# Patient Record
Sex: Female | Born: 1965 | Race: White | Hispanic: No | Marital: Married | Smoking: Never smoker
Health system: Southern US, Community
[De-identification: ages and names within clinical notes are randomized; demographics above are authoritative.]

## PROBLEM LIST (undated history)

## (undated) DIAGNOSIS — E039 Hypothyroidism, unspecified: Secondary | ICD-10-CM

## (undated) DIAGNOSIS — D0439 Carcinoma in situ of skin of other parts of face: Secondary | ICD-10-CM

## (undated) HISTORY — DX: Hypothyroidism, unspecified: E03.9

## (undated) HISTORY — DX: Carcinoma in situ of skin of other parts of face: D04.39

---

## 2006-09-25 ENCOUNTER — Encounter: Admission: RE | Admit: 2006-09-25 | Discharge: 2006-09-25 | Payer: Self-pay | Admitting: Specialist

## 2007-03-06 ENCOUNTER — Encounter: Admission: RE | Admit: 2007-03-06 | Discharge: 2007-03-06 | Payer: Self-pay | Admitting: *Deleted

## 2007-09-26 ENCOUNTER — Encounter: Admission: RE | Admit: 2007-09-26 | Discharge: 2007-09-26 | Payer: Self-pay | Admitting: Obstetrics and Gynecology

## 2007-10-24 ENCOUNTER — Ambulatory Visit (HOSPITAL_COMMUNITY): Admission: RE | Admit: 2007-10-24 | Discharge: 2007-10-24 | Payer: Self-pay | Admitting: Family Medicine

## 2008-11-08 ENCOUNTER — Encounter: Admission: RE | Admit: 2008-11-08 | Discharge: 2008-11-08 | Payer: Self-pay | Admitting: Family Medicine

## 2010-07-18 ENCOUNTER — Other Ambulatory Visit: Payer: Self-pay | Admitting: Specialist

## 2010-07-18 DIAGNOSIS — Z1231 Encounter for screening mammogram for malignant neoplasm of breast: Secondary | ICD-10-CM

## 2010-07-25 ENCOUNTER — Ambulatory Visit
Admission: RE | Admit: 2010-07-25 | Discharge: 2010-07-25 | Disposition: A | Discharge status: Home / Self Care | Payer: Self-pay | Source: Ambulatory Visit | Attending: Specialist | Admitting: Specialist

## 2010-07-25 DIAGNOSIS — Z1231 Encounter for screening mammogram for malignant neoplasm of breast: Secondary | ICD-10-CM

## 2011-08-08 ENCOUNTER — Other Ambulatory Visit: Payer: Self-pay | Admitting: Specialist

## 2011-08-08 DIAGNOSIS — Z139 Encounter for screening, unspecified: Secondary | ICD-10-CM

## 2011-08-14 ENCOUNTER — Ambulatory Visit (INDEPENDENT_AMBULATORY_CARE_PROVIDER_SITE_OTHER): Payer: BC Managed Care – PPO

## 2011-08-14 DIAGNOSIS — Z139 Encounter for screening, unspecified: Secondary | ICD-10-CM

## 2011-08-14 DIAGNOSIS — Z1231 Encounter for screening mammogram for malignant neoplasm of breast: Secondary | ICD-10-CM

## 2012-07-31 ENCOUNTER — Other Ambulatory Visit: Payer: Self-pay | Admitting: Specialist

## 2012-07-31 DIAGNOSIS — Z1231 Encounter for screening mammogram for malignant neoplasm of breast: Secondary | ICD-10-CM

## 2012-08-14 ENCOUNTER — Ambulatory Visit (INDEPENDENT_AMBULATORY_CARE_PROVIDER_SITE_OTHER): Payer: BC Managed Care – PPO

## 2012-08-14 DIAGNOSIS — Z1231 Encounter for screening mammogram for malignant neoplasm of breast: Secondary | ICD-10-CM

## 2013-08-03 ENCOUNTER — Other Ambulatory Visit: Payer: Self-pay | Admitting: Specialist

## 2013-08-03 DIAGNOSIS — Z1231 Encounter for screening mammogram for malignant neoplasm of breast: Secondary | ICD-10-CM

## 2013-08-06 ENCOUNTER — Ambulatory Visit: Payer: BC Managed Care – PPO

## 2013-08-20 ENCOUNTER — Ambulatory Visit (INDEPENDENT_AMBULATORY_CARE_PROVIDER_SITE_OTHER): Payer: BC Managed Care – PPO

## 2013-08-20 DIAGNOSIS — Z1231 Encounter for screening mammogram for malignant neoplasm of breast: Secondary | ICD-10-CM

## 2014-08-05 ENCOUNTER — Other Ambulatory Visit (HOSPITAL_BASED_OUTPATIENT_CLINIC_OR_DEPARTMENT_OTHER): Payer: Self-pay | Admitting: Specialist

## 2014-08-05 DIAGNOSIS — Z1239 Encounter for other screening for malignant neoplasm of breast: Secondary | ICD-10-CM

## 2014-08-26 ENCOUNTER — Ambulatory Visit (INDEPENDENT_AMBULATORY_CARE_PROVIDER_SITE_OTHER): Payer: BC Managed Care – PPO

## 2014-08-26 DIAGNOSIS — Z1231 Encounter for screening mammogram for malignant neoplasm of breast: Secondary | ICD-10-CM | POA: Diagnosis not present

## 2014-08-26 DIAGNOSIS — Z1239 Encounter for other screening for malignant neoplasm of breast: Secondary | ICD-10-CM

## 2015-08-01 ENCOUNTER — Other Ambulatory Visit: Payer: Self-pay | Admitting: Specialist

## 2015-08-01 DIAGNOSIS — Z1231 Encounter for screening mammogram for malignant neoplasm of breast: Secondary | ICD-10-CM

## 2015-08-31 ENCOUNTER — Ambulatory Visit: Payer: BC Managed Care – PPO

## 2015-09-06 ENCOUNTER — Ambulatory Visit (INDEPENDENT_AMBULATORY_CARE_PROVIDER_SITE_OTHER): Payer: BC Managed Care – PPO

## 2015-09-06 DIAGNOSIS — Z1231 Encounter for screening mammogram for malignant neoplasm of breast: Secondary | ICD-10-CM

## 2015-09-06 DIAGNOSIS — R928 Other abnormal and inconclusive findings on diagnostic imaging of breast: Secondary | ICD-10-CM | POA: Diagnosis not present

## 2015-09-07 ENCOUNTER — Other Ambulatory Visit: Payer: Self-pay | Admitting: Specialist

## 2015-09-07 DIAGNOSIS — R928 Other abnormal and inconclusive findings on diagnostic imaging of breast: Secondary | ICD-10-CM

## 2015-09-13 ENCOUNTER — Ambulatory Visit
Admission: RE | Admit: 2015-09-13 | Discharge: 2015-09-13 | Disposition: A | Discharge status: Home / Self Care | Payer: BC Managed Care – PPO | Source: Ambulatory Visit | Attending: Specialist | Admitting: Specialist

## 2015-09-13 DIAGNOSIS — R928 Other abnormal and inconclusive findings on diagnostic imaging of breast: Secondary | ICD-10-CM

## 2016-03-07 DIAGNOSIS — E039 Hypothyroidism, unspecified: Secondary | ICD-10-CM | POA: Insufficient documentation

## 2016-08-20 ENCOUNTER — Other Ambulatory Visit: Payer: Self-pay | Admitting: Specialist

## 2016-08-20 DIAGNOSIS — Z1231 Encounter for screening mammogram for malignant neoplasm of breast: Secondary | ICD-10-CM

## 2016-09-26 ENCOUNTER — Other Ambulatory Visit: Payer: Self-pay | Admitting: Specialist

## 2016-09-26 ENCOUNTER — Ambulatory Visit (INDEPENDENT_AMBULATORY_CARE_PROVIDER_SITE_OTHER): Payer: BC Managed Care – PPO

## 2016-09-26 DIAGNOSIS — Z1231 Encounter for screening mammogram for malignant neoplasm of breast: Secondary | ICD-10-CM

## 2017-09-09 ENCOUNTER — Other Ambulatory Visit: Payer: Self-pay | Admitting: Specialist

## 2017-09-09 DIAGNOSIS — Z1231 Encounter for screening mammogram for malignant neoplasm of breast: Secondary | ICD-10-CM

## 2017-09-27 ENCOUNTER — Ambulatory Visit (INDEPENDENT_AMBULATORY_CARE_PROVIDER_SITE_OTHER): Payer: BC Managed Care – PPO

## 2017-09-27 DIAGNOSIS — Z1231 Encounter for screening mammogram for malignant neoplasm of breast: Secondary | ICD-10-CM | POA: Diagnosis not present

## 2018-09-26 ENCOUNTER — Other Ambulatory Visit: Payer: Self-pay | Admitting: Nurse Practitioner

## 2018-09-26 DIAGNOSIS — Z1231 Encounter for screening mammogram for malignant neoplasm of breast: Secondary | ICD-10-CM

## 2018-10-22 ENCOUNTER — Ambulatory Visit (INDEPENDENT_AMBULATORY_CARE_PROVIDER_SITE_OTHER): Payer: BC Managed Care – PPO

## 2018-10-22 ENCOUNTER — Other Ambulatory Visit: Payer: Self-pay

## 2018-10-22 DIAGNOSIS — Z1231 Encounter for screening mammogram for malignant neoplasm of breast: Secondary | ICD-10-CM

## 2019-09-23 ENCOUNTER — Other Ambulatory Visit: Payer: Self-pay | Admitting: Nurse Practitioner

## 2019-09-23 ENCOUNTER — Other Ambulatory Visit: Payer: Self-pay | Admitting: Specialist

## 2019-09-23 DIAGNOSIS — Z1231 Encounter for screening mammogram for malignant neoplasm of breast: Secondary | ICD-10-CM

## 2019-10-28 ENCOUNTER — Other Ambulatory Visit: Payer: Self-pay

## 2019-10-28 ENCOUNTER — Ambulatory Visit (INDEPENDENT_AMBULATORY_CARE_PROVIDER_SITE_OTHER): Payer: BC Managed Care – PPO

## 2019-10-28 DIAGNOSIS — Z1231 Encounter for screening mammogram for malignant neoplasm of breast: Secondary | ICD-10-CM | POA: Diagnosis not present

## 2020-03-07 ENCOUNTER — Encounter: Payer: Self-pay | Admitting: Family Medicine

## 2020-03-07 ENCOUNTER — Ambulatory Visit (INDEPENDENT_AMBULATORY_CARE_PROVIDER_SITE_OTHER): Payer: Self-pay | Admitting: Family Medicine

## 2020-03-07 ENCOUNTER — Other Ambulatory Visit: Payer: Self-pay

## 2020-03-07 VITALS — BP 124/79 | HR 67 | Temp 98.0°F | Ht 65.0 in | Wt 123.1 lb

## 2020-03-07 DIAGNOSIS — E039 Hypothyroidism, unspecified: Secondary | ICD-10-CM

## 2020-03-07 DIAGNOSIS — Z1211 Encounter for screening for malignant neoplasm of colon: Secondary | ICD-10-CM

## 2020-03-07 DIAGNOSIS — G5620 Lesion of ulnar nerve, unspecified upper limb: Secondary | ICD-10-CM | POA: Insufficient documentation

## 2020-03-07 DIAGNOSIS — G5622 Lesion of ulnar nerve, left upper limb: Secondary | ICD-10-CM

## 2020-03-07 DIAGNOSIS — K829 Disease of gallbladder, unspecified: Secondary | ICD-10-CM

## 2020-03-07 NOTE — Assessment & Plan Note (Addendum)
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  Does not want to do anything for this at this time.

## 2020-03-07 NOTE — Assessment & Plan Note (Signed)
Last [place] in 2018, will order updated imaging.  Check LFT's today. She remains hesitant about surgery.  Discussed potential complications of not having removal.

## 2020-03-07 NOTE — Assessment & Plan Note (Addendum)
Managed by [facility], feels good at current dose of medcations.

## 2020-03-07 NOTE — Patient Instructions (Signed)
Gallbladder Eating Plan If you have a gallbladder condition, you may have trouble digesting fats. Eating a low-fat diet can help reduce your symptoms, and may be helpful before and after having surgery to remove your gallbladder (cholecystectomy). Your health care provider may recommend that you work with a diet and nutrition specialist (dietitian) to help you reduce the amount of fat in your diet. What are tips for following this plan? General guidelines  Limit your fat intake to less than 30% of your total daily calories. If you eat around 1,800 calories each day, this is less than 60 grams (g) of fat per day.  Fat is an important part of a healthy diet. Eating a low-fat diet can make it hard to maintain a healthy body weight. Ask your dietitian how much fat, calories, and other nutrients you need each day.  Eat small, frequent meals throughout the day instead of three large meals.  Drink at least 8-10 cups of fluid a day. Drink enough fluid to keep your urine clear or pale yellow.  Limit alcohol intake to no more than 1 drink a day for nonpregnant women and 2 drinks a day for men. One drink equals 12 oz of beer, 5 oz of wine, or 1 oz of hard liquor. Reading food labels  Check Nutrition Facts on food labels for the amount of fat per serving. Choose foods with less than 3 grams of fat per serving.   Shopping  Choose nonfat and low-fat healthy foods. Look for the words "nonfat," "low fat," or "fat free."  Avoid buying processed or prepackaged foods. Cooking  Cook using low-fat methods, such as baking, broiling, grilling, or boiling.  Cook with small amounts of healthy fats, such as olive oil, grapeseed oil, canola oil, or sunflower oil. What foods are recommended?  All fresh, frozen, or canned fruits and vegetables.  Whole grains.  Low-fat or non-fat (skim) milk and yogurt.  Lean meat, skinless poultry, fish, eggs, and beans.  Low-fat protein supplement powders or  drinks.  Spices and herbs. What foods are not recommended?  High-fat foods. These include baked goods, fast food, fatty cuts of meat, ice cream, french toast, sweet rolls, pizza, cheese bread, foods covered with butter, creamy sauces, or cheese.  Fried foods. These include french fries, tempura, battered fish, breaded chicken, fried breads, and sweets.  Foods with strong odors.  Foods that cause bloating and gas. Summary  A low-fat diet can be helpful if you have a gallbladder condition, or before and after gallbladder surgery.  Limit your fat intake to less than 30% of your total daily calories. This is about 60 g of fat if you eat 1,800 calories each day.  Eat small, frequent meals throughout the day instead of three large meals. This information is not intended to replace advice given to you by your health care provider. Make sure you discuss any questions you have with your health care provider. Document Revised: 08/06/2019 Document Reviewed: 08/06/2019 Elsevier Patient Education  2021 Elsevier Inc.  

## 2020-03-07 NOTE — Progress Notes (Signed)
Wendy Wilcox - 55 y.o. female MRN [id number]  Date of birth: [birth year]  Subjective Chief Complaint  Patient presents with  . Establish Care    HPI Wendy Wilcox is a 55 y.o. here today to establish care.  She has a history of hypothyroidism and gallbladder disease.  Her hypothyroidism is managed by [facility] and she is currently treated with levothyroxine and liothyronine.  She is also taking progesterone supplement.    She has history of gallbladder disease as well.  She has had gallbladder "attacks" previously and has been controlling with dietary changes.  She has had [place] previously showing cholelithiasis and gallbladder polyps.  She has been trying to avoid surgery as long as possible due to downtime associated with this.  She has had some diarrhea and mild nausea associated with this.   She is also having some numbness and tingling in her L ring finger.  She denies injury, neck pain, elbow pain.  She has has some mild stiffness in some of her joints.    ROS:  A comprehensive ROS was completed and negative except as noted per HPI  Allergies  Allergen Reactions  . Penicillins Rash and Hives    Past Medical History:  Diagnosis Date  . Hypothyroid   . 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 No pertinent surgical history.  Social History   Socioeconomic History  . Marital status: Married    Spouse name: Not on file  . Number of children: Not on file  . Years of education: Not on file  . Highest education level: Not on file  Occupational History  . Occupation: Runner, broadcasting/film/video  Tobacco Use  . Smoking status: Never Smoker  . Smokeless tobacco: Never Used  Vaping Use  . Vaping Use: Never used  Substance and Sexual Activity  . Alcohol use: Never  . Drug use: Never  . Sexual activity: Yes  Other Topics Concern  . Not on file  Social History Narrative  . Not on file   Social Determinants of Health   Financial Resource Strain: Not on  file  Food Insecurity: Not on file  Transportation Needs: Not on file  Physical Activity: Not on file  Stress: Not on file  Social Connections: Not on file    Family History  Problem Relation Age of Onset  . Hypertension Mother   . Hypertension Father   . Heart attack Father   . Skin cancer Father   . Breast cancer Paternal Aunt   . Diabetes Paternal Grandmother     Health Maintenance  Topic Date Due  . Hepatitis C Screening  Never done  . HIV Screening  Never done  . PAP SMEAR-Modifier  Never done  . COLONOSCOPY (Pts 45-26yrs Insurance coverage will need to be confirmed)  Never done  . COVID-19 Vaccine (3 - Booster for Moderna series) 03/23/2020 (Originally 02/14/2020)  . TETANUS/TDAP  03/07/2021 (Originally 04/07/2016)  . MAMMOGRAM  10/27/2021  . INFLUENZA VACCINE  Completed  . HPV VACCINES  Aged Out     ----------------------------------------------------------------------------------------------------------------------------------------------------------------------------------------------------------------- Physical Exam BP 124/79   Pulse 67   Temp 98 F (36.7 C)   Ht 5\' 5"  (1.651 m)   Wt 123 lb 1.9 oz (55.8 kg)   LMP 09/13/2017   SpO2 98%   BMI 20.49 kg/m   Physical Exam Constitutional:      Appearance: Normal appearance.  HENT:     Head: Normocephalic and atraumatic.  Eyes:  General: No scleral icterus. Cardiovascular:     Rate and Rhythm: Normal rate and regular rhythm.  Pulmonary:     Effort: Pulmonary effort is normal.     Breath sounds: Normal breath sounds.  Musculoskeletal:     Cervical back: Neck supple.  Neurological:     General: No focal deficit present.     Mental Status: She is alert.  Psychiatric:        Mood and Affect: Mood normal.        Behavior: Behavior normal.      ------------------------------------------------------------------------------------------------------------------------------------------------------------------------------------------------------------------- Assessment and Plan  Hypothyroid Managed by [facility], feels good at current dose of medcations.   Gallbladder disease Last [place] in 2018, will order updated imaging.  Check LFT's today. She remains hesitant about surgery.  Discussed potential complications of not having removal.    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  Does not want to do anything for this at this time.    No orders of the defined types were placed in this encounter.  Orders Placed This Encounter  Procedures  . US Abdomen Limited RUQ (LIVER/GB)    Standing Status:   Future    Standing Expiration Date:   03/07/2021    Order Specific Question:   Reason for Exam (SYMPTOM  OR DIAGNOSIS REQUIRED)    Answer:   RUQ pain, history of gallstones    Order Specific Question:   Preferred imaging location?    Answer:   [facility]  . Cologuard  . COMPLETE METABOLIC PANEL WITH GFR  . CBC    No follow-ups on file.    This visit occurred during the SARS-CoV-2 public health emergency.  Safety protocols were in place, including screening questions prior to the visit, additional usage of staff PPE, and extensive cleaning of exam room while observing appropriate contact time as indicated for disinfecting solutions.

## 2020-03-08 LAB — CBC
HCT: 42 % (ref 35.0–45.0)
Hemoglobin: 13.9 g/dL (ref 11.7–15.5)
MCH: 31.1 pg (ref 27.0–33.0)
MCHC: 33.1 g/dL (ref 32.0–36.0)
MCV: 94 fL (ref 80.0–100.0)
MPV: 9.8 fL (ref 7.5–12.5)
Platelets: 328 10*3/uL (ref 140–400)
RBC: 4.47 10*6/uL (ref 3.80–5.10)
RDW: 12.5 % (ref 11.0–15.0)
WBC: 5.5 10*3/uL (ref 3.8–10.8)

## 2020-03-08 LAB — COMPLETE METABOLIC PANEL WITH GFR
AG Ratio: 1.7 (calc) (ref 1.0–2.5)
ALT: 14 U/L (ref 6–29)
AST: 13 U/L (ref 10–35)
Albumin: 4.5 g/dL (ref 3.6–5.1)
Alkaline phosphatase (APISO): 61 U/L (ref 37–153)
BUN/Creatinine Ratio: 36 (calc) — ABNORMAL HIGH (ref 6–22)
BUN: 27 mg/dL — ABNORMAL HIGH (ref 7–25)
CO2: 26 mmol/L (ref 20–32)
Calcium: 9.4 mg/dL (ref 8.6–10.4)
Chloride: 106 mmol/L (ref 98–110)
Creat: 0.74 mg/dL (ref 0.50–1.05)
GFR, Est African American: 106 mL/min/{1.73_m2} (ref >=60)
GFR, Est Non African American: 91 mL/min/{1.73_m2} (ref >=60)
Globulin: 2.6 g/dL (calc) (ref 1.9–3.7)
Glucose, Bld: 87 mg/dL (ref 65–99)
Potassium: 4.4 mmol/L (ref 3.5–5.3)
Sodium: 140 mmol/L (ref 135–146)
Total Bilirubin: 0.4 mg/dL (ref 0.2–1.2)
Total Protein: 7.1 g/dL (ref 6.1–8.1)

## 2020-03-15 ENCOUNTER — Other Ambulatory Visit: Payer: Self-pay

## 2020-03-25 ENCOUNTER — Other Ambulatory Visit: Payer: Self-pay

## 2020-03-25 ENCOUNTER — Ambulatory Visit (INDEPENDENT_AMBULATORY_CARE_PROVIDER_SITE_OTHER): Payer: BC Managed Care – PPO

## 2020-03-25 DIAGNOSIS — R1011 Right upper quadrant pain: Secondary | ICD-10-CM

## 2020-03-25 DIAGNOSIS — K829 Disease of gallbladder, unspecified: Secondary | ICD-10-CM | POA: Diagnosis not present

## 2020-06-24 LAB — COLOGUARD: Cologuard: NEGATIVE

## 2020-06-30 LAB — COLOGUARD: COLOGUARD: NEGATIVE

## 2020-07-01 ENCOUNTER — Telehealth: Payer: Self-pay

## 2020-07-01 NOTE — Telephone Encounter (Signed)
Pt advised of negative Cologuard results.

## 2020-10-04 ENCOUNTER — Other Ambulatory Visit: Payer: Self-pay | Admitting: Specialist

## 2020-10-04 DIAGNOSIS — Z1231 Encounter for screening mammogram for malignant neoplasm of breast: Secondary | ICD-10-CM

## 2020-11-03 ENCOUNTER — Other Ambulatory Visit: Payer: Self-pay

## 2020-11-03 ENCOUNTER — Ambulatory Visit (INDEPENDENT_AMBULATORY_CARE_PROVIDER_SITE_OTHER): Payer: BC Managed Care – PPO

## 2020-11-03 DIAGNOSIS — Z1231 Encounter for screening mammogram for malignant neoplasm of breast: Secondary | ICD-10-CM | POA: Diagnosis not present

## 2021-10-06 ENCOUNTER — Other Ambulatory Visit: Payer: Self-pay

## 2021-10-06 DIAGNOSIS — Z1231 Encounter for screening mammogram for malignant neoplasm of breast: Secondary | ICD-10-CM

## 2021-11-09 ENCOUNTER — Ambulatory Visit (INDEPENDENT_AMBULATORY_CARE_PROVIDER_SITE_OTHER): Payer: BC Managed Care – PPO

## 2021-11-09 DIAGNOSIS — Z1231 Encounter for screening mammogram for malignant neoplasm of breast: Secondary | ICD-10-CM

## 2021-11-15 ENCOUNTER — Encounter: Payer: Self-pay | Admitting: Family Medicine

## 2021-11-15 ENCOUNTER — Ambulatory Visit: Payer: BC Managed Care – PPO | Admitting: Family Medicine

## 2021-11-15 ENCOUNTER — Ambulatory Visit (INDEPENDENT_AMBULATORY_CARE_PROVIDER_SITE_OTHER): Payer: BC Managed Care – PPO

## 2021-11-15 VITALS — BP 107/69 | HR 73 | Ht 65.0 in | Wt 126.1 lb

## 2021-11-15 DIAGNOSIS — M25562 Pain in left knee: Secondary | ICD-10-CM

## 2021-11-15 DIAGNOSIS — M7122 Synovial cyst of popliteal space [Baker], left knee: Secondary | ICD-10-CM

## 2021-11-15 MED ORDER — MELOXICAM 15 MG PO TABS: 15.0000 mg | ORAL_TABLET | Freq: Every day | ORAL | 0 refills | Status: DC

## 2021-11-15 NOTE — Patient Instructions (Signed)
Take meloxicam daily x2 weeks then daily as needed.  Ice at the end of the day each day.  We'll be in touch with xray results.

## 2021-11-19 ENCOUNTER — Encounter: Payer: Self-pay | Admitting: Family Medicine

## 2021-11-19 DIAGNOSIS — M1712 Unilateral primary osteoarthritis, left knee: Secondary | ICD-10-CM | POA: Insufficient documentation

## 2021-11-19 DIAGNOSIS — M25562 Pain in left knee: Secondary | ICD-10-CM | POA: Insufficient documentation

## 2021-11-19 DIAGNOSIS — M7122 Synovial cyst of popliteal space [Baker], left knee: Secondary | ICD-10-CM | POA: Insufficient documentation

## 2021-11-19 NOTE — Progress Notes (Signed)
Wendy Wilcox - 56 y.o. female MRN [id number]  Date of birth: 10-05-1965  Subjective Chief Complaint  Patient presents with   Knee Pain    Left knee pain    HPI Wendy Wilcox is a 56 year old female here today with complaint of knee pain.  Pain is located in the posterior knee.  She has noticed this for a few days.  She does not recall any known injury or overuse.  Pain is worse when flexing the knee and or crossing affected leg over top of the leg.  She denies any radiation of pain, numbness or tingling.  She has not had any joint swelling.  She has not tried anything so far for pain.  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 No pertinent surgical history.  Social History   Socioeconomic History   Marital status: Married    Spouse name: Not on file   Number of children: Not on file   Years of education: Not on file   Highest education level: Not on file  Occupational History   Occupation: Teacher  Tobacco Use   Smoking status: Never   Smokeless tobacco: Never  Vaping Use   Vaping Use: Never used  Substance and Sexual Activity   Alcohol use: Never   Drug use: Never   Sexual activity: Yes  Other Topics Concern   Not on file  Social History Narrative   Not on file   Social Determinants of Health   Financial Resource Strain: Not on file  Food Insecurity: Not on file  Transportation Needs: Not on file  Physical Activity: Not on file  Stress: Not on file  Social Connections: Not on file    Family History  Problem Relation Age of Onset   Hypertension Mother    Hypertension Father    Heart attack Father    Skin cancer Father    Breast cancer Paternal Aunt    Diabetes Paternal Grandmother     Health Maintenance  Topic  Date Due   HIV Screening  Never done   Hepatitis C Screening  Never done   PAP SMEAR-Modifier  Never done   COLONOSCOPY (Pts 45-66yrs Insurance coverage will need to be confirmed)  Never done   COVID-19 Vaccine (3 - Moderna series) 12/01/2021 (Originally 10/09/2019)   Zoster Vaccines- Shingrix (1 of 2) 02/15/2022 (Originally 09/02/2014)   INFLUENZA VACCINE  04/01/2022 (Originally 08/01/2021)   MAMMOGRAM  11/10/2023   HPV VACCINES  Aged Out     ----------------------------------------------------------------------------------------------------------------------------------------------------------------------------------------------------------------- Physical Exam BP 107/69 (BP Location: Left Arm, Patient Position: Sitting, Cuff Size: Normal)   Pulse 73   Ht 5\' 5"  (1.651 m)   Wt 126 lb 1.9 oz (57.2 kg)   LMP 09/13/2017   SpO2 100%   BMI 20.99 kg/m   Physical Exam Constitutional:      Appearance: Normal appearance.  HENT:     Head: Normocephalic and atraumatic.  Musculoskeletal:     Comments: She does have some slight fullness to the popliteal space with palpable area consistent with Baker's cyst.  There is no tenderness along the joint line bilaterally.  Ligament structures intact with negative Lachman's.  Neurological:     Mental Status: She is alert.     -------------------------------------------------------------------------------------------------------------------------------------------------------------------------------------------------------------------  Assessment and Plan  Synovial cyst of left popliteal space Exam consistent with Baker's cyst.  X-rays of the left knee ordered.  Starting meloxicam x2 weeks then as needed.  Instructed to let me know if symptoms are worsening or she develops new symptoms.  DVT considered however she does not have any other risk factors for this and there is no distal swelling or calf pain associated with this.   Meds ordered this  encounter  Medications   meloxicam (MOBIC) 15 MG tablet    Sig: Take 1 tablet (15 mg total) by mouth daily.    Dispense:  30 tablet    Refill:  0    No follow-ups on file.    This visit occurred during the SARS-CoV-2 public health emergency.  Safety protocols were in place, including screening questions prior to the visit, additional usage of staff PPE, and extensive cleaning of exam room while observing appropriate contact time as indicated for disinfecting solutions.

## 2021-11-19 NOTE — Assessment & Plan Note (Signed)
Exam consistent with Baker's cyst.  X-rays of the left knee ordered.  Starting meloxicam x2 weeks then as needed.  Instructed to let me know if symptoms are worsening or she develops new symptoms.  DVT considered however she does not have any other risk factors for this and there is no distal swelling or calf pain associated with this.

## 2021-11-24 ENCOUNTER — Telehealth: Payer: BC Managed Care – PPO | Admitting: Physician Assistant

## 2021-11-24 DIAGNOSIS — H109 Unspecified conjunctivitis: Secondary | ICD-10-CM

## 2021-11-24 DIAGNOSIS — B9689 Other specified bacterial agents as the cause of diseases classified elsewhere: Secondary | ICD-10-CM

## 2021-11-24 MED ORDER — POLYMYXIN B-TRIMETHOPRIM 10000-0.1 UNIT/ML-% OP SOLN: 1.0000 [drp] | OPHTHALMIC | 0 refills | Status: DC

## 2021-11-24 NOTE — Progress Notes (Signed)

## 2022-01-11 ENCOUNTER — Ambulatory Visit (INDEPENDENT_AMBULATORY_CARE_PROVIDER_SITE_OTHER): Payer: BC Managed Care – PPO | Admitting: Sports Medicine

## 2022-01-11 ENCOUNTER — Ambulatory Visit (INDEPENDENT_AMBULATORY_CARE_PROVIDER_SITE_OTHER): Payer: BC Managed Care – PPO

## 2022-01-11 DIAGNOSIS — M79675 Pain in left toe(s): Secondary | ICD-10-CM | POA: Diagnosis not present

## 2022-01-11 DIAGNOSIS — M1712 Unilateral primary osteoarthritis, left knee: Secondary | ICD-10-CM | POA: Diagnosis not present

## 2022-01-11 DIAGNOSIS — M7989 Other specified soft tissue disorders: Secondary | ICD-10-CM | POA: Diagnosis not present

## 2022-01-11 NOTE — Progress Notes (Addendum)
    Procedures performed today:    None.  Independent interpretation of notes and tests performed by another provider:   None.  Brief History, Exam, Impression, and Recommendations:    Primary osteoarthritis of left knee Lateral joint line pain, moderate gelling, x-rays with mild in the form of tibial spine spurring. Continue meloxicam 1/2-1 tab daily, adding home conditioning. Avoid high-impact activities. Return to see me in 4 to 6 weeks. Will consider steroid injection if not better. Visco would be afterwards.  Pain and swelling of third toe, left Also has noted a couple of weeks of a tender nodule plantar aspect left third proximal phalanx. I am not entirely sure what this is, we did an unofficial ultrasound that showed a isoechoic to hypoechoic subcutaneous mass. I think this is mostly in close approximation with the flexor digitorum tendon sheath. Will treat conservatively with meloxicam as above, would like some x-rays of her foot. At the 4 to 6-week follow-up if it still present we will do an MRI and to ensure were not dealing with neoplasia. If MRI is benign we will do an injection.  X-ray is unrevealing, proceeding with MRI to evaluate the mass plantar aspect of the left third toe.  For insurance coverage purposes diagnosis is left third toe mass, x-ray and ultrasound were unrevealing, I did an ultrasound on the day of the visit.  I spent 30 minutes of total time managing this patient today, this includes chart review, face to face, and non-face to face time.  ____________________________________________ Gwen Her. Dianah Field, M.D., ABFM., CAQSM., AME. Primary Care and Sports Medicine [facility]  Adjunct Professor of [employer]  Risk manager

## 2022-01-11 NOTE — Assessment & Plan Note (Addendum)
Lateral joint line pain, moderate gelling, x-rays with mild in the form of tibial spine spurring. Continue meloxicam 1/2-1 tab daily, adding home conditioning. Avoid high-impact activities. Return to see me in 4 to 6 weeks. Will consider steroid injection if not better. [place]co would be afterwards.

## 2022-01-11 NOTE — Assessment & Plan Note (Addendum)
Also has noted a couple of weeks of a tender nodule plantar aspect left third proximal phalanx. I am not entirely sure what this is, we did an unofficial ultrasound that showed a isoechoic to hypoechoic subcutaneous mass. I think this is mostly in close approximation with the flexor digitorum tendon sheath. Will treat conservatively with meloxicam as above, would like some x-rays of her foot. At the 4 to 6-week follow-up if it still present we will do an MRI and to ensure were not dealing with neoplasia. If MRI is benign we will do an injection.  X-ray is unrevealing, proceeding with MRI to evaluate the mass plantar aspect of the left third toe.  For insurance coverage purposes diagnosis is left third toe mass, x-ray and ultrasound were unrevealing, I did an ultrasound on the day of the visit.

## 2022-01-15 NOTE — Addendum Note (Signed)
Addended by: Silverio Decamp on: 01/15/2022 04:42 PM   Modules accepted: Orders

## 2022-01-18 ENCOUNTER — Encounter: Payer: Self-pay | Admitting: Sports Medicine

## 2022-01-22 MED ORDER — MELOXICAM 15 MG PO TABS: 15.0000 mg | ORAL_TABLET | Freq: Every day | ORAL | 3 refills | Status: DC

## 2022-02-08 ENCOUNTER — Ambulatory Visit: Payer: BC Managed Care – PPO | Admitting: Sports Medicine

## 2022-03-05 ENCOUNTER — Ambulatory Visit: Payer: BC Managed Care – PPO | Admitting: Sports Medicine

## 2022-03-05 DIAGNOSIS — G8929 Other chronic pain: Secondary | ICD-10-CM

## 2022-03-05 DIAGNOSIS — M25562 Pain in left knee: Secondary | ICD-10-CM | POA: Diagnosis not present

## 2022-03-05 DIAGNOSIS — M79675 Pain in left toe(s): Secondary | ICD-10-CM | POA: Diagnosis not present

## 2022-03-05 DIAGNOSIS — M7989 Other specified soft tissue disorders: Secondary | ICD-10-CM

## 2022-03-05 MED ORDER — TRAMADOL HCL 50 MG PO TABS: 50.0000 mg | ORAL_TABLET | Freq: Three times a day (TID) | ORAL | 0 refills | Status: DC | PRN

## 2022-03-05 NOTE — Assessment & Plan Note (Signed)
For the most part resolved.

## 2022-03-05 NOTE — Progress Notes (Signed)
    Procedures performed today:    None.  Independent interpretation of notes and tests performed by another provider:   None.  Brief History, Exam, Impression, and Recommendations:    Pain and swelling of third toe, left For the most part resolved.  Left knee pain This pleasant 57 year old female returns, we have been treating for left knee pain since early January, thus she has had greater than 6 weeks of conservative treatment including physician directed home conditioning, meloxicam as not effective, x-rays did show only minimal spurring. She continues to have discomfort posterior and laterally. Exam is for the most part unrevealing, she does have a negative McMurray's sign, she does have joint line tenderness laterally. Proceed with MRI, we will potentially do an injection based on the results of the MRI.    ____________________________________________ Gwen Her. Dianah Field, M.D., ABFM., CAQSM., AME. Primary Care and Sports Medicine [place] [facility]  Adjunct Professor of [employer]  Risk manager

## 2022-03-05 NOTE — Assessment & Plan Note (Signed)
This pleasant 57 year old female returns, we have been treating for left knee pain since early January, thus she has had greater than 6 weeks of conservative treatment including physician directed home conditioning, meloxicam as not effective, x-rays did show only minimal spurring. She continues to have discomfort posterior and laterally. Exam is for the most part unrevealing, she does have a negative McMurray's sign, she does have joint line tenderness laterally. Proceed with MRI, we will potentially do an injection based on the results of the MRI.

## 2022-03-18 ENCOUNTER — Ambulatory Visit (INDEPENDENT_AMBULATORY_CARE_PROVIDER_SITE_OTHER): Payer: BC Managed Care – PPO

## 2022-03-18 DIAGNOSIS — M25562 Pain in left knee: Secondary | ICD-10-CM | POA: Diagnosis not present

## 2022-03-18 DIAGNOSIS — G8929 Other chronic pain: Secondary | ICD-10-CM | POA: Diagnosis not present

## 2022-07-06 ENCOUNTER — Telehealth (INDEPENDENT_AMBULATORY_CARE_PROVIDER_SITE_OTHER): Payer: BC Managed Care – PPO | Admitting: Family Medicine

## 2022-07-06 ENCOUNTER — Encounter: Payer: Self-pay | Admitting: Family Medicine

## 2022-07-06 VITALS — Ht 65.0 in | Wt 124.0 lb

## 2022-07-06 DIAGNOSIS — F419 Anxiety disorder, unspecified: Secondary | ICD-10-CM

## 2022-07-06 IMAGING — MG MM DIGITAL SCREENING BILAT W/ TOMO AND CAD
8 series · 9 of 24 positions shown · non-contrast
Comparison: Previous exam(s).

CLINICAL DATA: Screening.

EXAM:
DIGITAL SCREENING BILATERAL MAMMOGRAM WITH TOMOSYNTHESIS AND CAD
TECHNIQUE: Bilateral screening digital craniocaudal and mediolateral oblique
mammograms were obtained. Bilateral screening digital breast
tomosynthesis was performed. The images were evaluated with
computer-aided detection.

[R CC synth-2D]
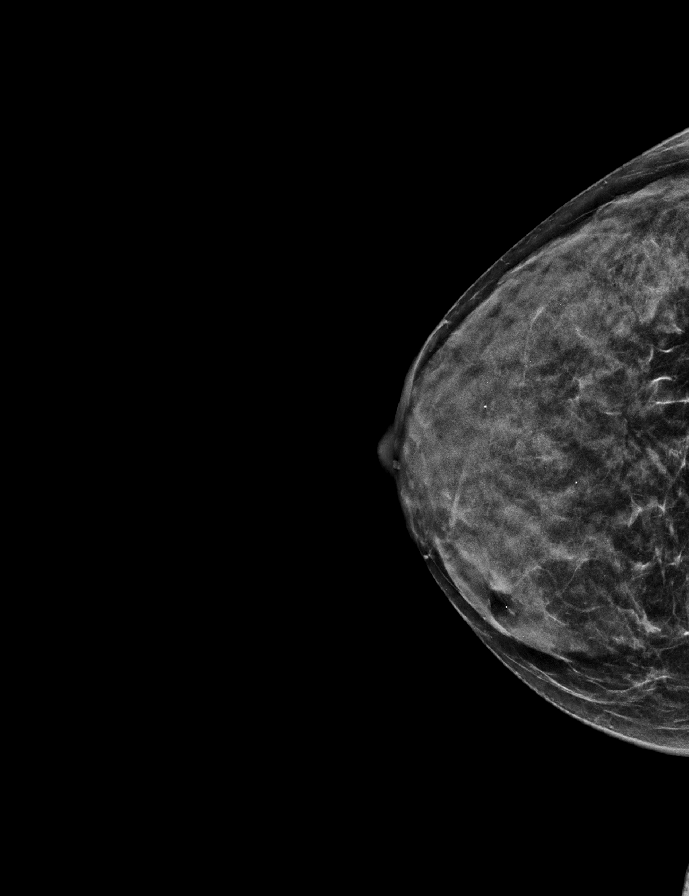

[L MLO synth-2D]
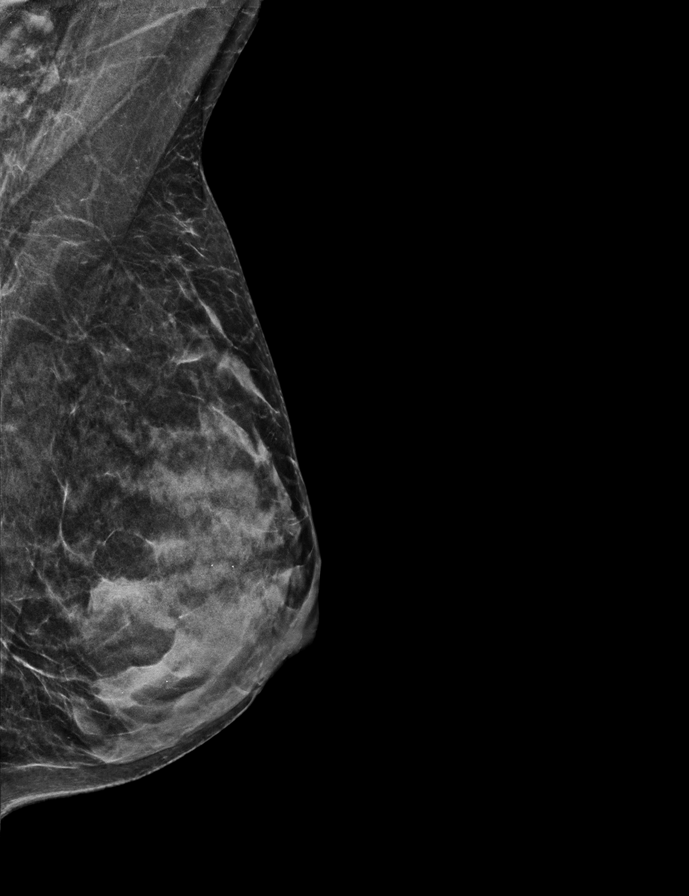

[L CC synth-2D]
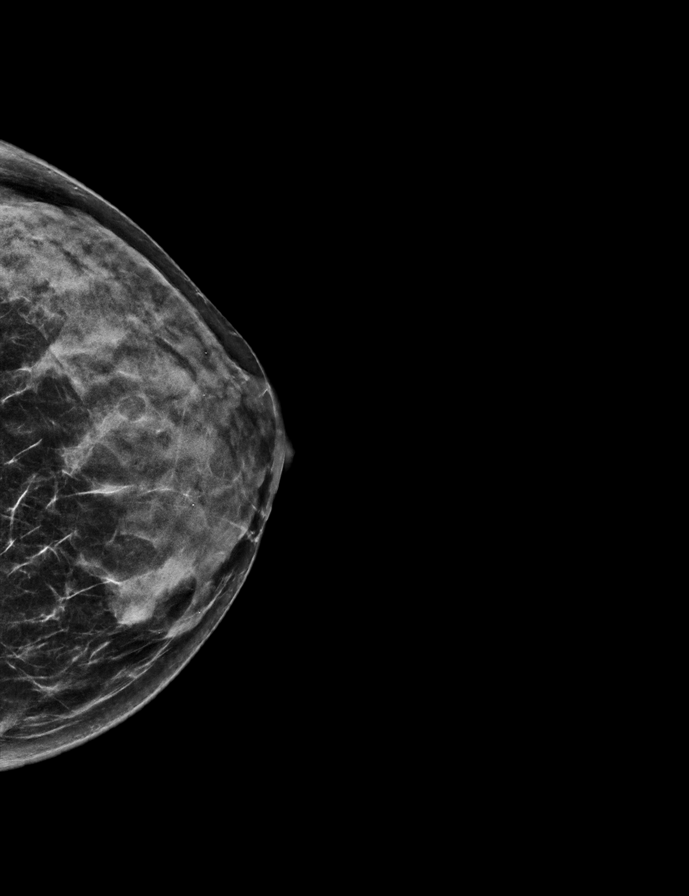

[R MLO synth-2D]
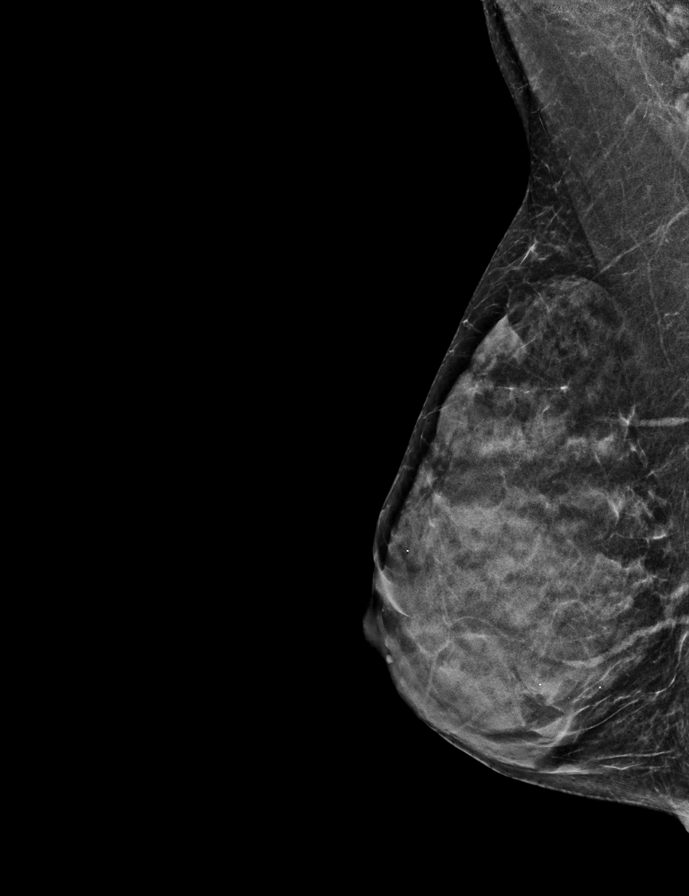

[L CC tomo · 2 of 48 frames shown]
[frame 16/48]
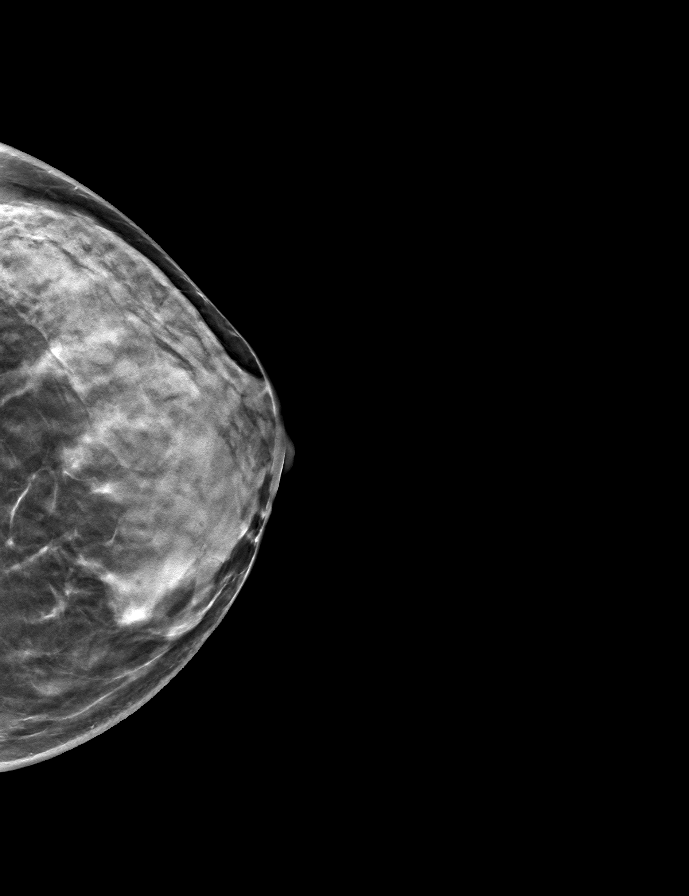
[frame 25/48]
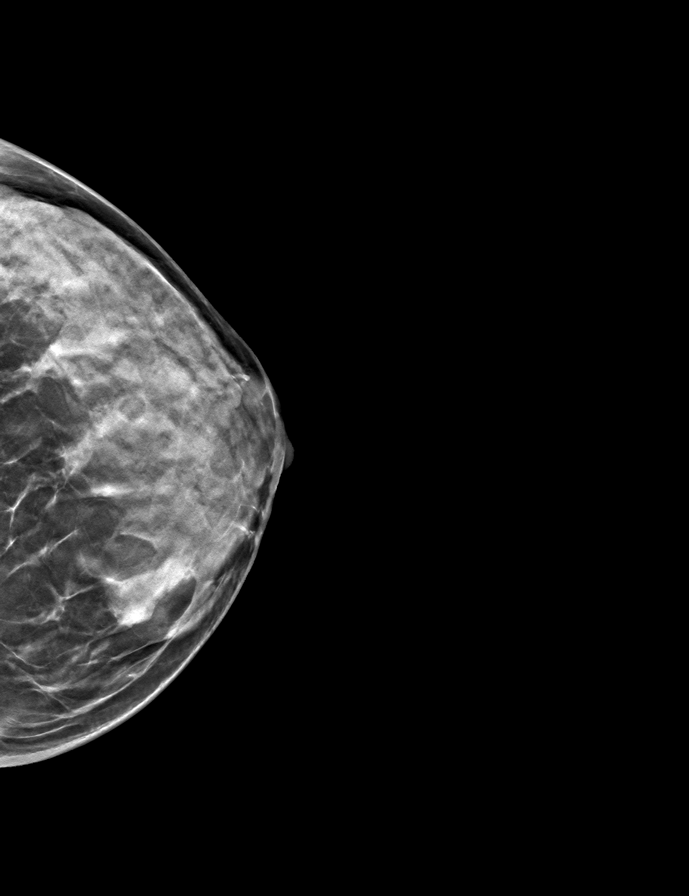

[L MLO tomo · tomo slice 23/46.0]
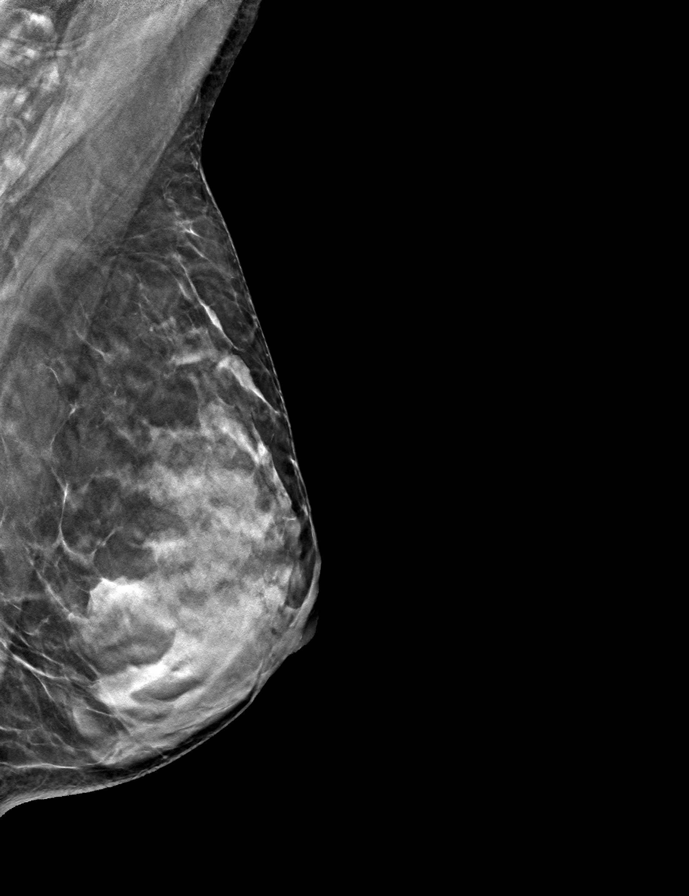

[R MLO tomo · tomo slice 21/42.0]
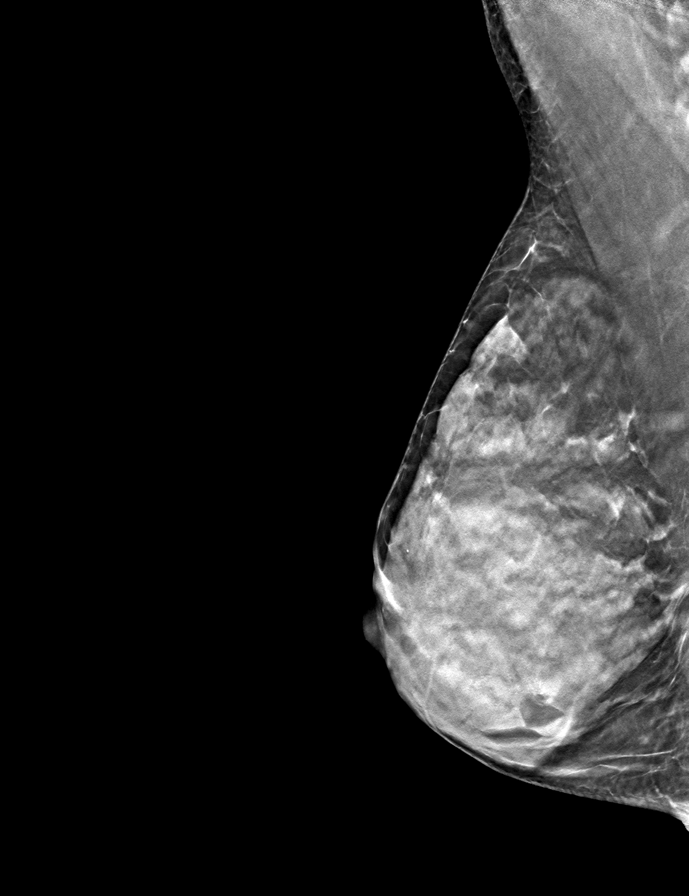

[R CC tomo · tomo slice 21/41.0]
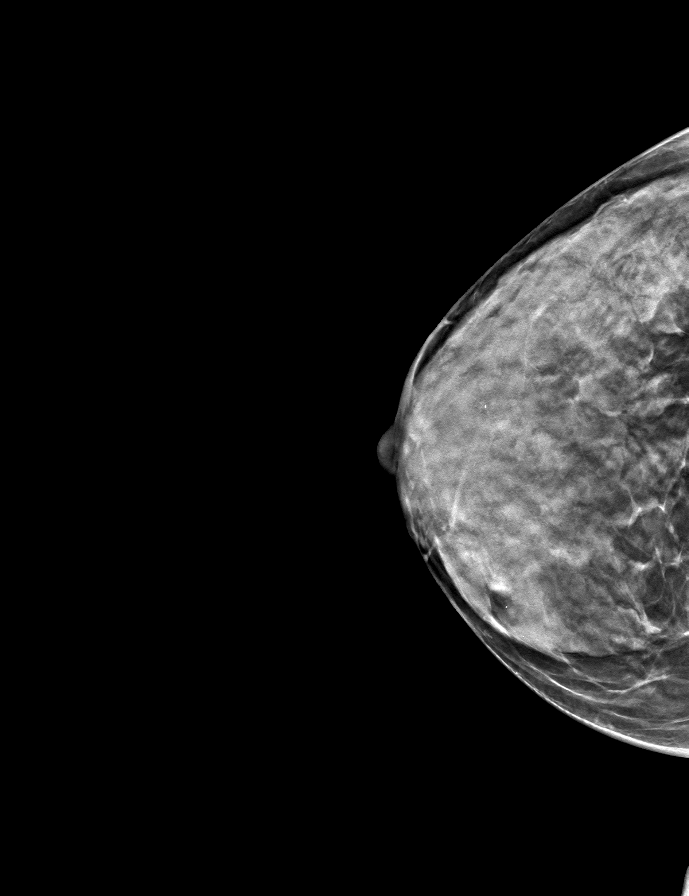

[9 of 24 positions shown; findings below may reference images not displayed]

ACR Breast Density Category d: The breast tissue is extremely dense,
which lowers the sensitivity of mammography
FINDINGS: There are no findings suspicious for malignancy.
IMPRESSION: No mammographic evidence of malignancy. A result letter of this
screening mammogram will be mailed directly to the patient.

RECOMMENDATION:
Screening mammogram in one year. (Code:TA-[id number])

BI-RADS CATEGORY  1: Negative.

## 2022-07-06 MED ORDER — ESCITALOPRAM OXALATE 10 MG PO TABS: ORAL_TABLET | ORAL | 0 refills | Status: DC

## 2022-07-06 NOTE — Progress Notes (Signed)
Wendy Wilcox - 57 y.o. female MRN [id number]  Date of birth: [birth year]   This visit type was conducted due to national recommendations for restrictions regarding the COVID-19 Pandemic (e.g. social distancing).  This format is felt to be most appropriate for this patient at this time.  All issues noted in this document were discussed and addressed.  No physical exam was performed (except for noted visual exam findings with Video Visits).  I discussed the limitations of evaluation and management by telemedicine and the availability of in person appointments. The patient expressed understanding and agreed to proceed.  I connected withNAME@ on 07/06/22 at 10:30 AM EDT by a video enabled telemedicine application and verified that I am speaking with the correct person using two identifiers.  Present at visit: Everrett Coombe, Gardiner Rhyme Wilcox   Patient Location: Home [address] [place] [address]   Provider location:   [facility]  Chief Complaint  Patient presents with   Anxiety    HPI  Wendy Wilcox is a 57 y.o. female who presents via audio/video conferencing for a telehealth visit today.  She reports increased symptoms of anxiety and dysphoric mood.  She has noticed this for a couple of months.  Sadly, she had to have her dog put down a couple of months ago which was quite difficult for her.  She hasn't really noted any other particular stressors.  She feels that she is more irritable with some difficulty sleeping. She did recently have her thyroid checked and this was normal.  She did have some depression when her father passed a few years ago and was on what sounds like an SSRI at that time.  She took this for a few months.       07/06/2022   10:13 AM 03/07/2020    4:18 PM  Depression screen PHQ 2/9  Decreased Interest 1 0  Down, Depressed, Hopeless 1 0  PHQ - 2 Score 2 0  Altered sleeping 3 3  Tired, decreased energy 2 2  Change in appetite 3 1  Feeling bad or  failure about yourself  0 0  Trouble concentrating 0 0  Moving slowly or fidgety/restless 0 0  Suicidal thoughts 0 0  PHQ-9 Score 10 6  Difficult doing work/chores Somewhat difficult Somewhat difficult      07/06/2022   10:14 AM 03/07/2020    4:21 PM 03/07/2020    4:18 PM  GAD 7 : Generalized Anxiety Score  Nervous, Anxious, on Edge 1 1 0  Control/stop worrying 1 0 0  Worry too much - different things 1 0 0  Trouble relaxing 1 1 0  Restless 0 1 0  Easily annoyed or irritable 3 2 0  Afraid - awful might happen 0 0 0  Total GAD 7 Score 7 5 0  Anxiety Difficulty Somewhat difficult  Not difficult at all     ROS:  A comprehensive ROS was completed and negative except as noted per HPI  Past Medical History:  Diagnosis Date   Hypothyroid    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 No pertinent surgical history.  Family History  Problem Relation Age of Onset   Hypertension Mother    Hypertension Father    Heart attack Father    Skin cancer Father    Breast cancer Paternal Aunt    Diabetes Paternal Grandmother     Social History   Socioeconomic History   Marital status:  Married    Spouse name: Not on file   Number of children: Not on file   Years of education: Not on file   Highest education level: Not on file  Occupational History   Occupation: Teacher  Tobacco Use   Smoking status: Never   Smokeless tobacco: Never  Vaping Use   Vaping Use: Never used  Substance and Sexual Activity   Alcohol use: Never   Drug use: Never   Sexual activity: Yes  Other Topics Concern   Not on file  Social History Narrative   Not on file   Social Determinants of Health   Financial Resource Strain: Not on file  Food Insecurity: Not on file  Transportation Needs: Not on file  Physical Activity: Not on file  Stress: Not on file  Social Connections: Not on file  Intimate Partner Violence: Not on file     Current Outpatient Medications:     escitalopram (LEXAPRO) 10 MG tablet, Start 5mg  daily x7 days then increase to 10mg  daily., Disp: 90 tablet, Rfl: 0   progesterone (PROMETRIUM) 100 MG capsule, Take 200 mg by mouth at bedtime., Disp: , Rfl:    thyroid (ARMOUR) 15 MG tablet, Take 15 mg by mouth daily., Disp: , Rfl:   EXAM:  VITALS per patient if applicable: Ht 5\' 5"  (1.651 m)   Wt 124 lb (56.2 kg)   LMP 09/13/2017   BMI 20.63 kg/m   GENERAL: alert, oriented, appears well and in no acute distress  HEENT: atraumatic, conjunttiva clear, no obvious abnormalities on inspection of external nose and ears  NECK: normal movements of the head and neck  LUNGS: on inspection no signs of respiratory distress, breathing rate appears normal, no obvious gross SOB, gasping or wheezing  CV: no obvious cyanosis  MS: moves all visible extremities without noticeable abnormality  PSYCH/NEURO: pleasant and cooperative, no obvious depression or anxiety, speech and thought processing grossly intact  ASSESSMENT AND PLAN:  Discussed the following assessment and plan:  Anxious mood [F41.9] We discussed options for management of her anxiety and mood dysphoria.  Adding lexapro 10mg  daily.  She isn't sure that therapy would be beneficial right now. We'll plan to follow up in about 6 weeks.      I discussed the assessment and treatment plan with the patient. The patient was provided an opportunity to ask questions and all were answered. The patient agreed with the plan and demonstrated an understanding of the instructions.   The patient was advised to call back or seek an in-person evaluation if the symptoms worsen or if the condition fails to improve as anticipated.    Everrett Coombe, DO

## 2022-07-06 NOTE — Assessment & Plan Note (Addendum)
We discussed options for management of her anxiety and mood dysphoria.  Adding lexapro 10mg  daily.  She isn't sure that therapy would be beneficial right now. We'll plan to follow up in about 6 weeks.

## 2022-07-06 NOTE — Progress Notes (Signed)
Called patient and she is now scheduled for her 6 weeks follow up appointment

## 2022-08-14 ENCOUNTER — Encounter: Payer: Self-pay | Admitting: Family Medicine

## 2022-08-14 ENCOUNTER — Telehealth (INDEPENDENT_AMBULATORY_CARE_PROVIDER_SITE_OTHER): Payer: BC Managed Care – PPO | Admitting: Family Medicine

## 2022-08-14 VITALS — Ht 65.0 in | Wt 123.0 lb

## 2022-08-14 DIAGNOSIS — F419 Anxiety disorder, unspecified: Secondary | ICD-10-CM | POA: Diagnosis not present

## 2022-08-14 NOTE — Progress Notes (Signed)
Wendy Wilcox - 57 y.o. female MRN [id number]  Date of birth: 01-Apr-1965   This visit type was conducted due to national recommendations for restrictions regarding the COVID-19 Pandemic (e.g. social distancing).  This format is felt to be most appropriate for this patient at this time.  All issues noted in this document were discussed and addressed.  No physical exam was performed (except for noted visual exam findings with Video Visits).  I discussed the limitations of evaluation and management by telemedicine and the availability of in person appointments. The patient expressed understanding and agreed to proceed.  I connected withNAME@ on 08/14/22 at  3:10 PM EDT by a video enabled telemedicine application and verified that I am speaking with the correct person using two identifiers.  Present at visit: Everrett Coombe, Gardiner Rhyme Wilcox   Patient Location: Home [address] [place] [address]   Provider location:   [facility]  Chief Complaint  Patient presents with   Anxiety   Depression    HPI  Wendy Wilcox is a 57 y.o. female who presents via audio/video conferencing for a telehealth visit today.  She is following up today for anxiety.  Her last visit we added Lexapro.  She reports that this seems to be working quite well for her.  She has not noticed any significant side effects other than some mild drowsiness.  She is taking this in the morning.   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 No pertinent surgical history.  Family History  Problem Relation Age of Onset   Hypertension Mother    Hypertension Father    Heart attack Father    Skin cancer Father    Breast cancer Paternal Aunt    Diabetes Paternal Grandmother     Social History   Socioeconomic History   Marital status:  Married    Spouse name: Not on file   Number of children: Not on file   Years of education: Not on file   Highest education level: Not on file  Occupational History   Occupation: Teacher  Tobacco Use   Smoking status: Never   Smokeless tobacco: Never  Vaping Use   Vaping status: Never Used  Substance and Sexual Activity   Alcohol use: Never   Drug use: Never   Sexual activity: Yes  Other Topics Concern   Not on file  Social History Narrative   Not on file   Social Determinants of Health   Financial Resource Strain: Not on file  Food Insecurity: Not on file  Transportation Needs: Not on file  Physical Activity: Not on file  Stress: Not on file  Social Connections: Not on file  Intimate Partner Violence: Not on file     Current Outpatient Medications:    escitalopram (LEXAPRO) 10 MG tablet, Start 5mg  daily x7 days then increase to 10mg  daily., Disp: 90 tablet, Rfl: 0   progesterone (PROMETRIUM) 100 MG capsule, Take 200 mg by mouth at bedtime., Disp: , Rfl:    thyroid (ARMOUR) 15 MG tablet, Take 15 mg by mouth daily., Disp: , Rfl:   EXAM:  VITALS per patient if applicable: Ht 5\' 5"  (1.651 m)   Wt 123 lb (55.8 kg)   LMP 09/13/2017   BMI 20.47 kg/m   GENERAL: alert, oriented, appears  well and in no acute distress  HEENT: atraumatic, conjunttiva clear, no obvious abnormalities on inspection of external nose and ears  NECK: normal movements of the head and neck  LUNGS: on inspection no signs of respiratory distress, breathing rate appears normal, no obvious gross SOB, gasping or wheezing  CV: no obvious cyanosis  MS: moves all visible extremities without noticeable abnormality  PSYCH/NEURO: pleasant and cooperative, no obvious depression or anxiety, speech and thought processing grossly intact  ASSESSMENT AND PLAN:  Discussed the following assessment and plan:  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  Will plan to continue  this at current strength.  Recommend follow-up in about 4 months.     I discussed the assessment and treatment plan with the patient. The patient was provided an opportunity to ask questions and all were answered. The patient agreed with the plan and demonstrated an understanding of the instructions.   The patient was advised to call back or seek an in-person evaluation if the symptoms worsen or if the condition fails to improve as anticipated.    Everrett Coombe, DO

## 2022-08-14 NOTE — Assessment & Plan Note (Signed)
She is doing well with Lexapro at current strength.  Will plan to continue this at current strength.  Recommend follow-up in about 4 months.

## 2022-08-15 ENCOUNTER — Telehealth: Payer: BC Managed Care – PPO | Admitting: Family Medicine

## 2022-10-01 ENCOUNTER — Other Ambulatory Visit: Payer: Self-pay | Admitting: Family Medicine

## 2022-10-02 MED ORDER — ESCITALOPRAM OXALATE 10 MG PO TABS: ORAL_TABLET | ORAL | 0 refills | Status: DC

## 2022-12-17 ENCOUNTER — Other Ambulatory Visit: Payer: Self-pay | Admitting: Pediatric Gastroenterology

## 2022-12-17 DIAGNOSIS — Z1231 Encounter for screening mammogram for malignant neoplasm of breast: Secondary | ICD-10-CM

## 2022-12-20 ENCOUNTER — Ambulatory Visit: Payer: BC Managed Care – PPO

## 2022-12-20 DIAGNOSIS — Z1231 Encounter for screening mammogram for malignant neoplasm of breast: Secondary | ICD-10-CM

## 2022-12-30 ENCOUNTER — Other Ambulatory Visit: Payer: Self-pay | Admitting: Family Medicine

## 2022-12-31 NOTE — Telephone Encounter (Signed)
Called spoke with patient she is scheduled on January 13th she requested a video visit

## 2022-12-31 NOTE — Telephone Encounter (Signed)
Pls contact the to schedule Anxiety med refill appt. Filled 30 day Rx. Thanks

## 2023-01-14 ENCOUNTER — Telehealth: Payer: BC Managed Care – PPO | Admitting: Family Medicine

## 2023-01-26 ENCOUNTER — Other Ambulatory Visit: Payer: Self-pay | Admitting: Family Medicine

## 2023-01-28 NOTE — Telephone Encounter (Signed)
Hold for 01/29/23 appt

## 2023-01-29 ENCOUNTER — Telehealth (INDEPENDENT_AMBULATORY_CARE_PROVIDER_SITE_OTHER): Payer: Self-pay | Admitting: Family Medicine

## 2023-01-29 ENCOUNTER — Encounter: Payer: Self-pay | Admitting: Family Medicine

## 2023-01-29 VITALS — Ht 65.0 in | Wt 123.0 lb

## 2023-01-29 DIAGNOSIS — F419 Anxiety disorder, unspecified: Secondary | ICD-10-CM

## 2023-01-29 MED ORDER — ESCITALOPRAM OXALATE 10 MG PO TABS: ORAL_TABLET | ORAL | 1 refills | Status: DC

## 2023-01-29 NOTE — Assessment & Plan Note (Signed)
She is doing well with Lexapro at current strength.  Will plan to continue this at current strength.  Recommend follow-up in about 4-5 months.

## 2023-01-29 NOTE — Progress Notes (Signed)
Wendy Wilcox - 58 y.o. female MRN [id number]  Date of birth: 04-05-1965   This visit type was conducted due to national recommendations for restrictions regarding the COVID-19 Pandemic (e.g. social distancing).  This format is felt to be most appropriate for this patient at this time.  All issues noted in this document were discussed and addressed.  No physical exam was performed (except for noted visual exam findings with Video Visits).  I discussed the limitations of evaluation and management by telemedicine and the availability of in person appointments. The patient expressed understanding and agreed to proceed.  I connected withNAME@ on 01/29/23 at  3:50 PM EST by a video enabled telemedicine application and verified that I am speaking with the correct person using two identifiers.  Present at visit: Everrett Coombe, Gardiner Rhyme Wilcox   Patient Location: Home [address] [place] [address]   Provider location:   [facility]  Chief Complaint  Patient presents with   Anxiety    HPI  Wendy Wilcox is a 58 y.o. female who presents via audio/video conferencing for a telehealth visit today.  She reports that she is doing pretty well.  Her family was affected by flooding in [place] this fall and her mother had a compression fracture of her spine which as contributed to some stress for her.  She feels that she is managing pretty well.  Lexapro remains effective for her.     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  Family History  Problem Relation Age of Onset   Hypertension Mother    Hypertension Father    Heart attack Father    Skin cancer Father    Breast cancer Paternal Aunt    Diabetes Paternal Grandmother     Social History   Socioeconomic History   Marital  status: Married    Spouse name: Not on file   Number of children: Not on file   Years of education: Not on file   Highest education level: Not on file  Occupational History   Occupation: Teacher  Tobacco Use   Smoking status: Never   Smokeless tobacco: Never  Vaping Use   Vaping status: Never Used  Substance and Sexual Activity   Alcohol use: Never   Drug use: Never   Sexual activity: Yes  Other Topics Concern   Not on file  Social History Narrative   Not on file   Social Drivers of Health   Financial Resource Strain: Not on file  Food Insecurity: Not on file  Transportation Needs: Not on file  Physical Activity: Not on file  Stress: Not on file  Social Connections: Not on file  Intimate Partner Violence: Not on file     Current Outpatient Medications:    progesterone (PROMETRIUM) 100 MG capsule, Take 200 mg by mouth at bedtime., Disp: , Rfl:    thyroid (ARMOUR) 15 MG tablet, Take 15 mg by mouth daily., Disp: , Rfl:    escitalopram (LEXAPRO) 10 MG tablet, TAKE 1 TABLET DAILY., Disp: 90 tablet, Rfl: 1  EXAM:  VITALS per patient if applicable: Ht 5\' 5"  (1.651 m)   Wt 123 lb (55.8 kg)   LMP 09/13/2017   BMI 20.47 kg/m   GENERAL: alert, oriented, appears well and in  no acute distress  HEENT: atraumatic, conjunttiva clear, no obvious abnormalities on inspection of external nose and ears  NECK: normal movements of the head and neck  LUNGS: on inspection no signs of respiratory distress, breathing rate appears normal, no obvious gross SOB, gasping or wheezing  CV: no obvious cyanosis  MS: moves all visible extremities without noticeable abnormality  PSYCH/NEURO: pleasant and cooperative, no obvious depression or anxiety, speech and thought processing grossly intact  ASSESSMENT AND PLAN:  Discussed the following assessment and plan:  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  Will plan to continue this at current strength.   Recommend follow-up in about 4-5 months.     I discussed the assessment and treatment plan with the patient. The patient was provided an opportunity to ask questions and all were answered. The patient agreed with the plan and demonstrated an understanding of the instructions.   The patient was advised to call back or seek an in-person evaluation if the symptoms worsen or if the condition fails to improve as anticipated.    Everrett Coombe, DO

## 2023-05-10 ENCOUNTER — Other Ambulatory Visit: Payer: Self-pay | Admitting: Family Medicine

## 2023-05-10 ENCOUNTER — Encounter (INDEPENDENT_AMBULATORY_CARE_PROVIDER_SITE_OTHER): Payer: Self-pay | Admitting: Family Medicine

## 2023-05-10 DIAGNOSIS — S90869A Insect bite (nonvenomous), unspecified foot, initial encounter: Secondary | ICD-10-CM

## 2023-05-10 DIAGNOSIS — W57XXXA Bitten or stung by nonvenomous insect and other nonvenomous arthropods, initial encounter: Secondary | ICD-10-CM

## 2023-05-10 MED ORDER — DOXYCYCLINE HYCLATE 100 MG PO TABS: 100.0000 mg | ORAL_TABLET | Freq: Two times a day (BID) | ORAL | 0 refills | Status: DC

## 2023-05-10 NOTE — Telephone Encounter (Signed)
 Please see the MyChart message reply(ies) for my assessment and plan.    This patient gave consent for this Medical Advice Message and is aware that it may result in a bill to [employer], as well as the possibility of receiving a bill for a co-payment or deductible. They are an established patient, but are not seeking medical advice exclusively about a problem treated during an in person or video visit in the last seven days. I did not recommend an in person or video visit within seven days of my reply.    I spent a total of 6 minutes cumulative time within 7 days through [employer].  Everrett Coombe, DO

## 2023-09-03 ENCOUNTER — Encounter: Payer: Self-pay | Admitting: Sports Medicine

## 2023-10-05 ENCOUNTER — Other Ambulatory Visit: Payer: Self-pay | Admitting: Family Medicine

## 2023-10-05 DIAGNOSIS — F419 Anxiety disorder, unspecified: Secondary | ICD-10-CM

## 2023-10-07 NOTE — Telephone Encounter (Signed)
 Pls contact pt to schedule appt for Lexapro  medication refills. Thx

## 2023-10-09 ENCOUNTER — Ambulatory Visit: Admitting: Family Medicine

## 2023-10-09 ENCOUNTER — Telehealth: Admitting: Family Medicine

## 2023-10-09 ENCOUNTER — Encounter: Payer: Self-pay | Admitting: Family Medicine

## 2023-10-09 DIAGNOSIS — F419 Anxiety disorder, unspecified: Secondary | ICD-10-CM | POA: Diagnosis not present

## 2023-10-09 DIAGNOSIS — Z1211 Encounter for screening for malignant neoplasm of colon: Secondary | ICD-10-CM | POA: Insufficient documentation

## 2023-10-09 MED ORDER — ESCITALOPRAM OXALATE 10 MG PO TABS: ORAL_TABLET | ORAL | 1 refills | Status: DC

## 2023-10-09 NOTE — Progress Notes (Signed)
 Wendy Wilcox - 58 y.o. female MRN [id number]  Date of birth: [birth year]   All issues noted in this document were discussed and addressed.  No physical exam was performed (except for noted visual exam findings with Video Visits).  I discussed the limitations of evaluation and management by telemedicine and the availability of in person appointments. The patient expressed understanding and agreed to proceed.  I connected withNAME@ on 10/09/23 at  1:10 PM EDT by a video enabled telemedicine application and verified that I am speaking with the correct person using two identifiers.  Present at visit: Velma Ku, Wendy Wilcox   Patient Location: Home [address] [place] [address]   Provider location:   [facility]  No chief complaint on file.   HPI  Wendy Wilcox is a 58 y.o. female who presents via audio/video conferencing for a telehealth visit today.  She is following up today for anxiety.   This has been managed with lexapro  10mg  daily.  She reports that this continues to work pretty well for her.  She does still have a lot of stress related to her job and would like to continue this for now.    She would like to have updated colon cancer screening.  Would like to do colonoscopy this time.   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  Family History  Problem Relation Age of Onset   Hypertension Mother    Hypertension Father    Heart attack Father    Skin cancer Father    Breast cancer Paternal Aunt    Diabetes Paternal Grandmother     Social History   Socioeconomic History   Marital status: Married    Spouse name: Not on file   Number of children: Not on file   Years of education: Not on file   Highest education level: Not on file  Occupational History    Occupation: Teacher  Tobacco Use   Smoking status: Never   Smokeless tobacco: Never  Vaping Use   Vaping status: Never Used  Substance and Sexual Activity   Alcohol use: Never   Drug use: Never   Sexual activity: Yes  Other Topics Concern   Not on file  Social History Narrative   Not on file   Social Drivers of Health   Financial Resource Strain: Not on file  Food Insecurity: Not on file  Transportation Needs: Not on file  Physical Activity: Not on file  Stress: Not on file  Social Connections: Not on file  Intimate Partner Violence: Not on file     Current Outpatient Medications:    doxycycline  (VIBRA -TABS) 100 MG tablet, Take 1 tablet (100 mg total) by mouth 2 (two) times daily., Disp: 20 tablet, Rfl: 0   escitalopram  (LEXAPRO ) 10 MG tablet, TAKE 1 TABLET DAILY., Disp: 90 tablet, Rfl: 1   progesterone (PROMETRIUM) 100 MG capsule, Take 200 mg by mouth at bedtime., Disp: , Rfl:    thyroid (ARMOUR) 15 MG tablet, Take 15 mg by mouth daily., Disp: , Rfl:   EXAM:  VITALS per patient if applicable: LMP 09/13/2017   GENERAL: alert, oriented, appears well and in no acute distress  HEENT: atraumatic, conjunttiva clear, no obvious abnormalities on inspection of external nose and ears  NECK: normal movements of the head and neck  LUNGS: on inspection no signs of respiratory distress, breathing rate appears normal, no obvious gross SOB, gasping or wheezing  CV: no obvious cyanosis  MS: moves all visible extremities without noticeable abnormality  PSYCH/NEURO: pleasant and cooperative, no obvious depression or anxiety, speech and thought processing grossly intact  ASSESSMENT AND PLAN:  Discussed the following assessment and plan:  Anxious mood [F41.9]   She is doing well with Lexapro  at current strength.  Will plan to continue this at current strength.  Recommend follow-up in about 6 months  Colon cancer screening Referral to GI for colonoscopy entered.      I  discussed the assessment and treatment plan with the patient. The patient was provided an opportunity to ask questions and all were answered. The patient agreed with the plan and demonstrated an understanding of the instructions.   The patient was advised to call back or seek an in-person evaluation if the symptoms worsen or if the condition fails to improve as anticipated.    Velma Ku, DO

## 2023-10-09 NOTE — Assessment & Plan Note (Signed)
 Referral to GI for colonoscopy entered.

## 2023-10-09 NOTE — Assessment & Plan Note (Signed)
 She is doing well with Lexapro  at current strength.  Will plan to continue this at current strength.  Recommend follow-up in about 6 months

## 2023-10-24 ENCOUNTER — Encounter: Payer: Self-pay | Admitting: Family Medicine

## 2023-12-04 ENCOUNTER — Encounter: Payer: Self-pay | Admitting: Family Medicine

## 2023-12-04 DIAGNOSIS — F419 Anxiety disorder, unspecified: Secondary | ICD-10-CM

## 2023-12-04 MED ORDER — ESCITALOPRAM OXALATE 20 MG PO TABS: ORAL_TABLET | ORAL | 1 refills | Status: AC

## 2023-12-04 NOTE — Telephone Encounter (Signed)
 Patient requesting rx rf of Lexapro  with strength increase to 20mg   Last written as 10mg  on 10/09/2023 Last OV 10/09/2023 telemedicine visit  Upcoming appt = none
